# Patient Record
Sex: Male | Born: 1960 | Race: Black or African American | Hispanic: No | Marital: Married | Smoking: Never smoker
Health system: Southern US, Community
[De-identification: ages and names within clinical notes are randomized; demographics above are authoritative.]

## PROBLEM LIST (undated history)

## (undated) DIAGNOSIS — E119 Type 2 diabetes mellitus without complications: Secondary | ICD-10-CM

## (undated) DIAGNOSIS — N289 Disorder of kidney and ureter, unspecified: Secondary | ICD-10-CM

## (undated) DIAGNOSIS — I10 Essential (primary) hypertension: Secondary | ICD-10-CM

## (undated) HISTORY — PX: HEMORRHOIDECTOMY WITH HEMORRHOID BANDING: SHX5633

---

## 2006-02-20 ENCOUNTER — Emergency Department (HOSPITAL_COMMUNITY): Admission: EM | Admit: 2006-02-20 | Discharge: 2006-02-20 | Payer: Self-pay | Admitting: Emergency Medicine

## 2013-02-21 ENCOUNTER — Other Ambulatory Visit: Payer: Self-pay | Admitting: *Deleted

## 2013-02-21 DIAGNOSIS — N189 Chronic kidney disease, unspecified: Secondary | ICD-10-CM

## 2013-03-01 ENCOUNTER — Ambulatory Visit
Admission: RE | Admit: 2013-03-01 | Discharge: 2013-03-01 | Disposition: A | Discharge status: Home / Self Care | Payer: 59 | Source: Ambulatory Visit | Attending: *Deleted | Admitting: *Deleted

## 2013-03-01 DIAGNOSIS — N189 Chronic kidney disease, unspecified: Secondary | ICD-10-CM

## 2014-03-19 IMAGING — US US RENAL
1 series · 14 of 25 positions shown · non-contrast
Comparison: None.

CLINICAL DATA: Chronic kidney disease, hyper 10

EXAM:
RENAL/URINARY TRACT ULTRASOUND COMPLETE

[Series 1: us renal · 0.22mm/px · 14 of 44 slices shown]
[im 1/44]
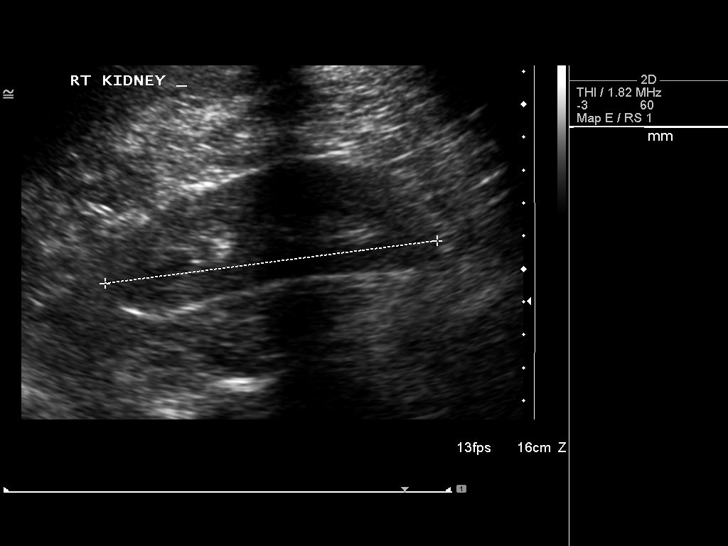
[im 4/44]
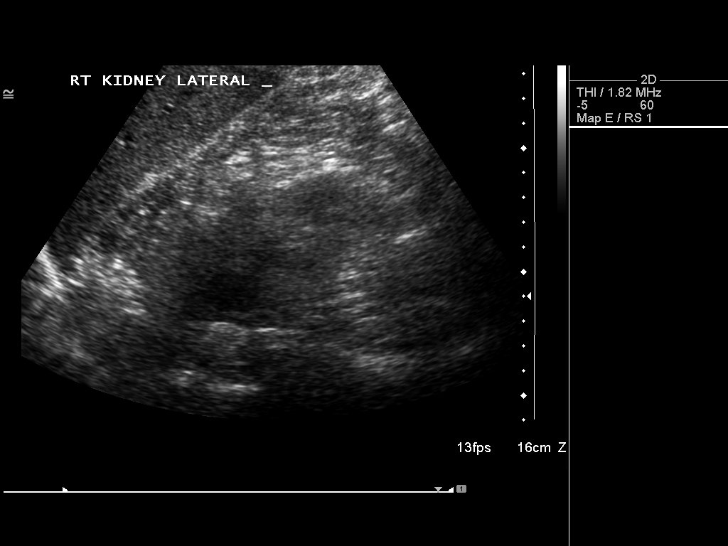
[im 8/44]
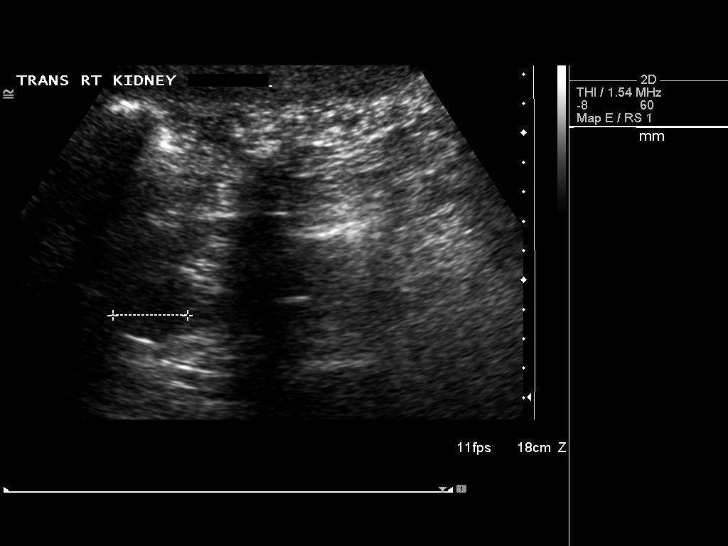
[im 11/44]
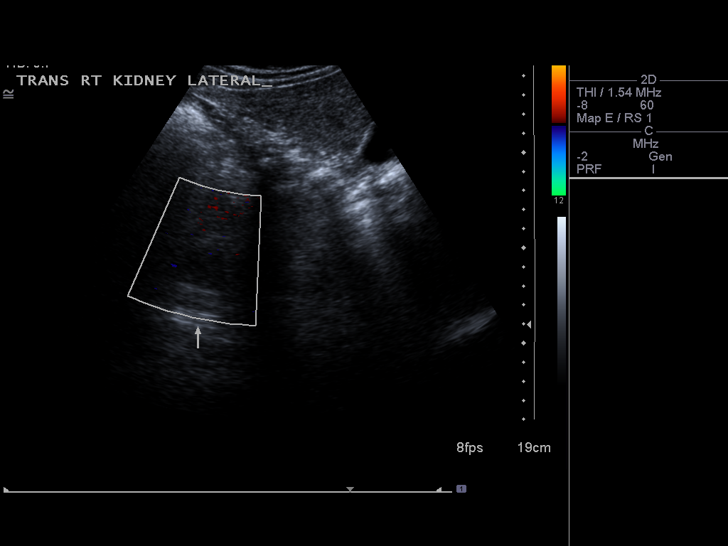
[im 15/44]
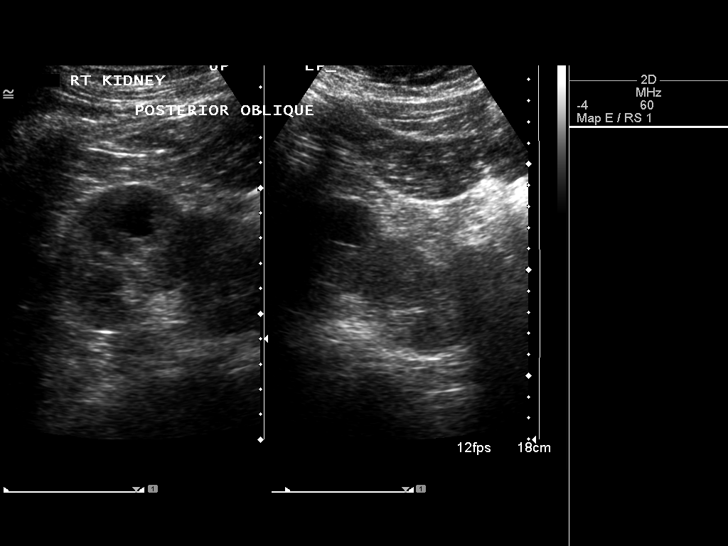
[im 17/44]
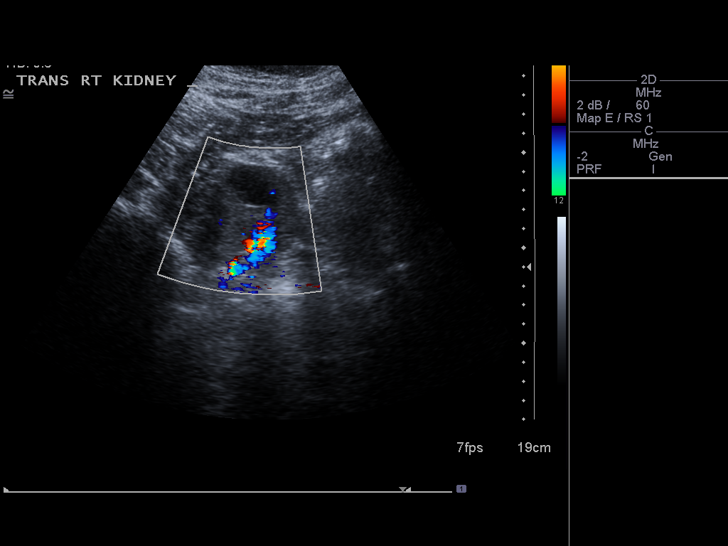
[im 20/44]
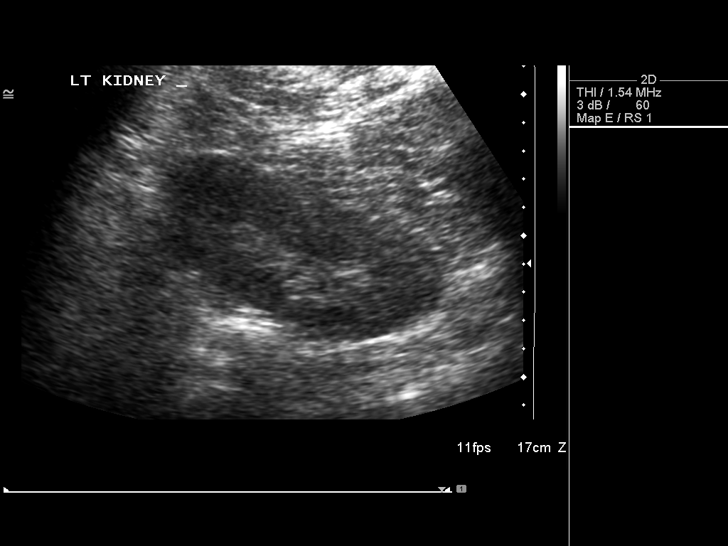
[im 24/44]
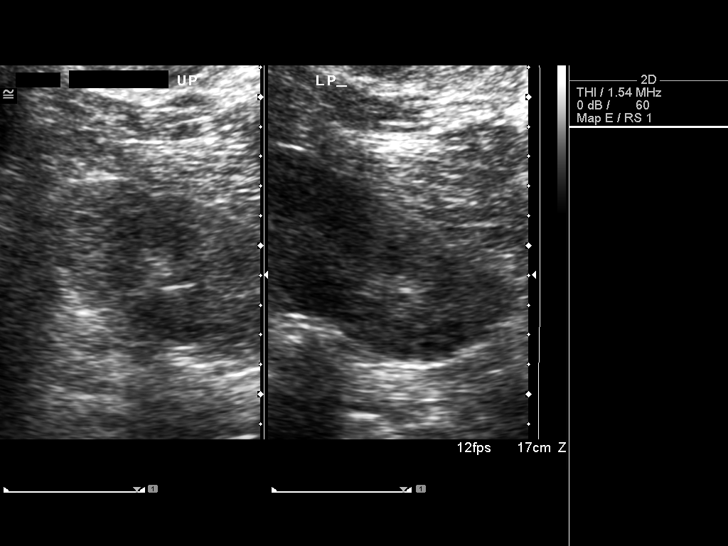
[im 27/44]
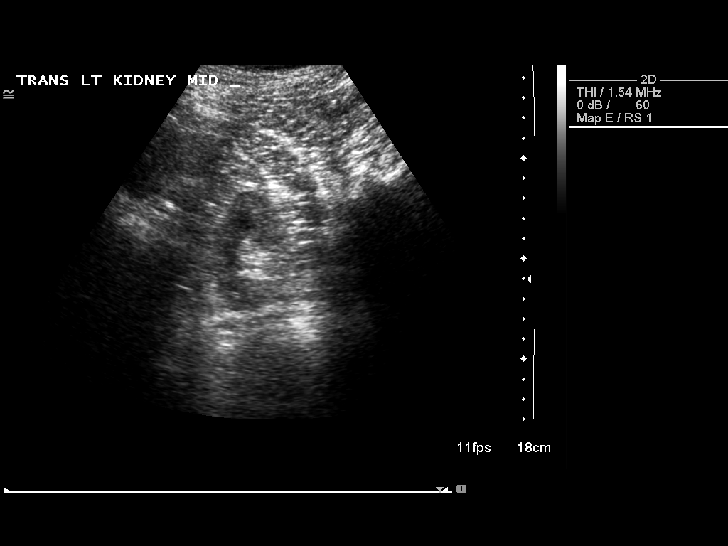
[im 29/44]
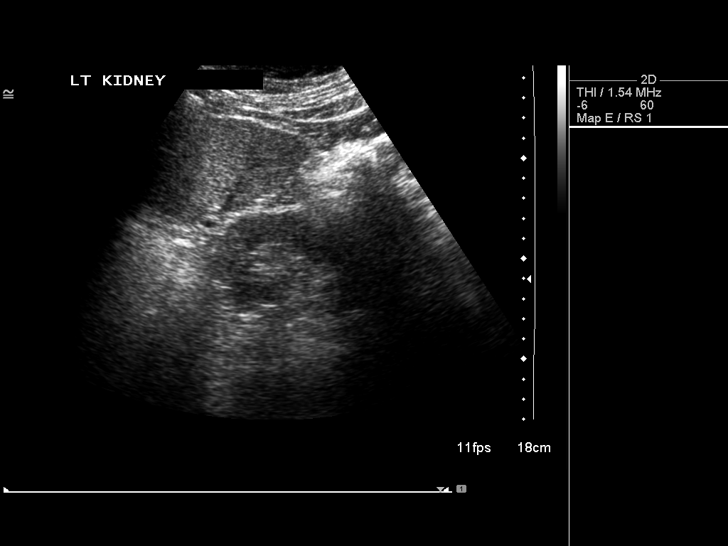
[im 33/44]
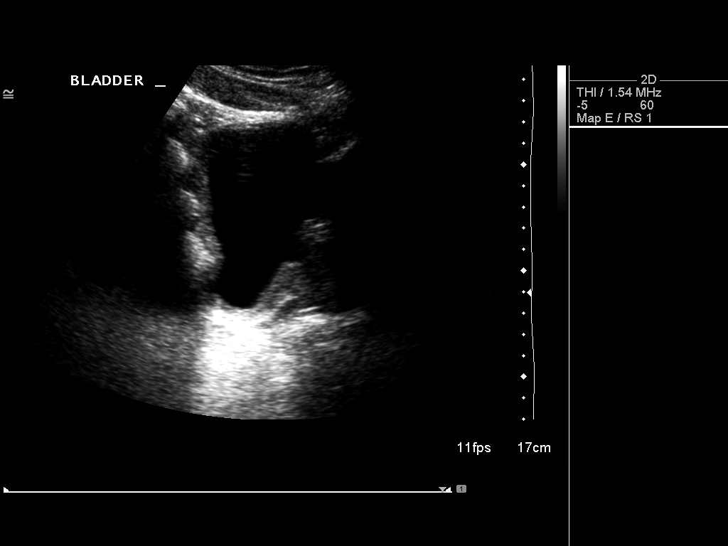
[im 36/44]
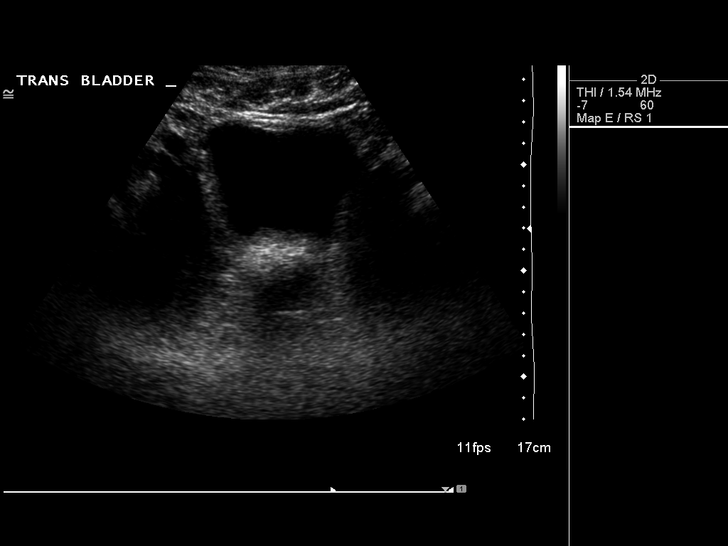
[im 40/44]
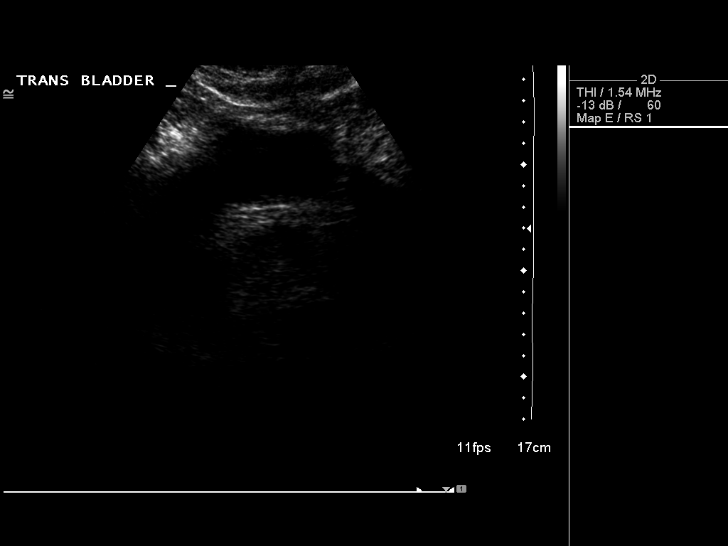
[im 44/44]
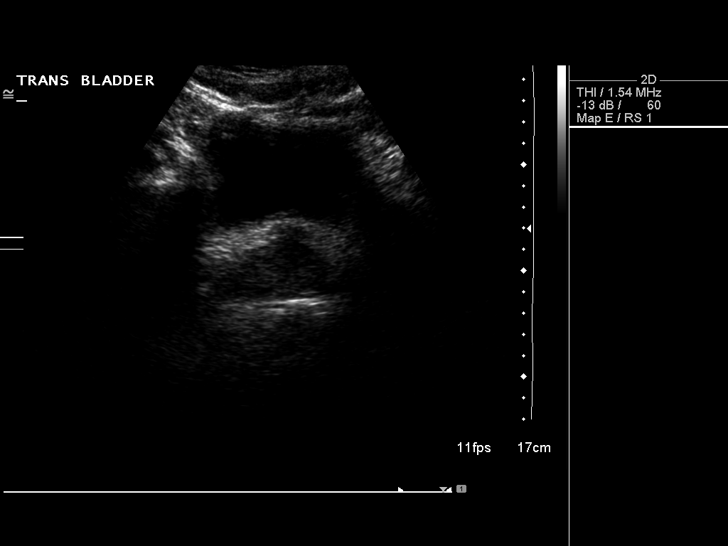

[14 of 25 positions shown; findings below may reference images not displayed]

FINDINGS: Right Kidney

Length: The right kidney measures 10.2 cm sagittally. The
echogenicity of the renal parenchyma is increased consistent with
chronic renal medical disease. A 2.6 cm cyst is noted laterally.

Left Kidney

Length: The left kidney measures 10.7 cm. The echogenicity of the
parenchyma is increased.

Bladder: The urinary bladder is unremarkable, with bilateral
ureteral jets noted.
IMPRESSION: 1. Echogenic renal parenchyma bilaterally consistent with chronic
renal medical disease. No hydronephrosis.
2. Bilateral ureteral jets are noted.

## 2016-11-14 ENCOUNTER — Emergency Department (HOSPITAL_BASED_OUTPATIENT_CLINIC_OR_DEPARTMENT_OTHER)
Admission: EM | Admit: 2016-11-14 | Discharge: 2016-11-14 | Disposition: A | Discharge status: Home / Self Care | Payer: Managed Care, Other (non HMO) | Attending: Emergency Medicine | Admitting: Emergency Medicine

## 2016-11-14 ENCOUNTER — Encounter (HOSPITAL_BASED_OUTPATIENT_CLINIC_OR_DEPARTMENT_OTHER): Payer: Self-pay | Admitting: Emergency Medicine

## 2016-11-14 DIAGNOSIS — E119 Type 2 diabetes mellitus without complications: Secondary | ICD-10-CM | POA: Insufficient documentation

## 2016-11-14 DIAGNOSIS — I10 Essential (primary) hypertension: Secondary | ICD-10-CM | POA: Diagnosis not present

## 2016-11-14 DIAGNOSIS — K649 Unspecified hemorrhoids: Secondary | ICD-10-CM | POA: Diagnosis not present

## 2016-11-14 DIAGNOSIS — K6289 Other specified diseases of anus and rectum: Secondary | ICD-10-CM | POA: Diagnosis present

## 2016-11-14 DIAGNOSIS — Z79899 Other long term (current) drug therapy: Secondary | ICD-10-CM | POA: Diagnosis not present

## 2016-11-14 HISTORY — DX: Essential (primary) hypertension: I10

## 2016-11-14 HISTORY — DX: Type 2 diabetes mellitus without complications: E11.9

## 2016-11-14 HISTORY — DX: Disorder of kidney and ureter, unspecified: N28.9

## 2016-11-14 MED ORDER — LIDOCAINE-HYDROCORTISONE ACE 3-1 % RE KIT: 1.0000 "application " | PACK | Freq: Two times a day (BID) | RECTAL | 0 refills | Status: AC | PRN

## 2016-11-14 NOTE — ED Provider Notes (Signed)
[facility] DEPT Provider Note   CSN: [id number] Arrival date & time: 11/14/16  1728 By signing my name below, I, Dyke Brackett, attest that this documentation has been prepared under the direction and in the presence of Davonna Belling, MD . Electronically Signed: Dyke Brackett, Scribe. 11/14/2016. 8:39 PM.   History   Chief Complaint Chief Complaint  Patient presents with  . Hemorrhoids   HPI Gregory Ashley is a 56 y.o. male who presents to the Emergency Department complaining of moderately painful hemorrhoids onset one week ago. He reports itching and burning to the area; pain is exacerbated by pressure and palpation. Pt has tried Sitz baths and Preporation H with no significant relief. Per pt, this feels similar to prior hemorrhoids. Pt states he had hemorrhoidectomy with hemorrhoid banding 10 years ago. Pt is traveling to [facility] next week and want to ensure that he will be able to travel. He denies any rectal bleeding.  PCP: Kathyrn Lass, MD  The history is provided by the patient. No language interpreter was used.   Past Medical History:  Diagnosis Date  . Diabetes mellitus without complication ([place])   . Hypertension   . Renal disorder     There are no active problems to display for this patient.   Past Surgical History:  Procedure Laterality Date  . HEMORRHOIDECTOMY WITH HEMORRHOID BANDING      Home Medications   Prior to Admission medications   Medication Sig Start Date End Date Taking? Authorizing Provider  enalapril (VASOTEC) 5 MG tablet Take 5 mg by mouth daily.   Yes [provider]  lidocaine-hydrocortisone (ANAMANTLE) 3-1 % KIT Place 1 application rectally 2 (two) times daily as needed (pain). 11/14/16   Davonna Belling, MD    Family History No family history on file.  Social History Social History  Substance Use Topics  . Smoking status: Never Smoker  . Smokeless tobacco: Never Used  . Alcohol use Not on file     Allergies     Patient has no known allergies.   Review of Systems Review of Systems  Constitutional: Negative for fever.  Gastrointestinal: Positive for rectal pain. Negative for anal bleeding.  Skin: Positive for wound.   Physical Exam Updated Vital Signs BP (!) 147/90 (BP Location: Right Arm)   Pulse 68   Temp 98.1 F (36.7 C) (Oral)   Resp 18   Ht 6' 3"  (1.905 m)   Wt 102.1 kg (225 lb)   SpO2 98%   BMI 28.12 kg/m   Physical Exam  Constitutional: He is oriented to person, place, and time. He appears well-developed and well-nourished. No distress.  HENT:  Head: Normocephalic and atraumatic.  Eyes: Conjunctivae are normal.  Cardiovascular: Normal rate.   Pulmonary/Chest: Effort normal.  Abdominal: He exhibits no distension.  Genitourinary: Rectal exam shows external hemorrhoid.  Genitourinary Comments: There is one 5 mm hemorrhoid with mild tenderness. There is a larger 1 cm firm hemorrhoid with no drainage, purulence or drainage.   Neurological: He is alert and oriented to person, place, and time.  Skin: Skin is warm and dry.  Nursing note and vitals reviewed.   ED Treatments / Results  DIAGNOSTIC STUDIES:  Oxygen Saturation is 97% on RA, normal by my interpretation.    COORDINATION OF CARE:  8:38 PM Discussed treatment plan with pt at bedside and pt agreed to plan.   Labs (all labs ordered are listed, but only abnormal results are displayed) Labs Reviewed - No data to display  EKG  EKG Interpretation None       Radiology No results found.  Procedures Procedures (including critical care time)  Medications Ordered in ED Medications - No data to display   Initial Impression / Assessment and Plan / ED Course  I have reviewed the triage vital signs and the nursing notes.  Pertinent labs & imaging results that were available during my care of the patient were reviewed by me and considered in my medical decision making (see chart for details).     Patient with  hemorrhoids. Will need follow-up. No active bleeding. Discharge home.  Final Clinical Impressions(s) / ED Diagnoses   Final diagnoses:  Hemorrhoids, unspecified hemorrhoid type    New Prescriptions Discharge Medication List as of 11/14/2016  8:48 PM    START taking these medications   Details  lidocaine-hydrocortisone (ANAMANTLE) 3-1 % KIT Place 1 application rectally 2 (two) times daily as needed (pain)., Starting Sat 11/14/2016, Print       I personally performed the services described in this documentation, which was scribed in my presence. The recorded information has been reviewed and is accurate.      Davonna Belling, MD 11/16/16 [id number]

## 2016-11-14 NOTE — ED Triage Notes (Signed)
Hemorrhoids x 1 week. Pt has had to have surgery for same in the past. No bleeding noted. Pt is traveling next week and wants to make sure they are ok before he travels.

## 2016-11-14 NOTE — ED Notes (Signed)
Pt called for triage, no response. 

## 2018-02-21 DIAGNOSIS — I10 Essential (primary) hypertension: Secondary | ICD-10-CM | POA: Diagnosis not present

## 2018-02-21 DIAGNOSIS — N183 Chronic kidney disease, stage 3 (moderate): Secondary | ICD-10-CM | POA: Diagnosis not present

## 2018-02-21 DIAGNOSIS — E119 Type 2 diabetes mellitus without complications: Secondary | ICD-10-CM | POA: Diagnosis not present

## 2018-05-03 DIAGNOSIS — I129 Hypertensive chronic kidney disease with stage 1 through stage 4 chronic kidney disease, or unspecified chronic kidney disease: Secondary | ICD-10-CM | POA: Diagnosis not present

## 2018-05-03 DIAGNOSIS — Z13220 Encounter for screening for lipoid disorders: Secondary | ICD-10-CM | POA: Diagnosis not present

## 2018-05-03 DIAGNOSIS — Z0000 Encounter for general adult medical examination without abnormal findings: Secondary | ICD-10-CM | POA: Diagnosis not present

## 2018-05-03 DIAGNOSIS — R7303 Prediabetes: Secondary | ICD-10-CM | POA: Diagnosis not present

## 2018-07-04 DIAGNOSIS — R531 Weakness: Secondary | ICD-10-CM | POA: Diagnosis not present

## 2020-01-20 LAB — COLOGUARD: COLOGUARD: NEGATIVE

## 2020-01-20 LAB — EXTERNAL GENERIC LAB PROCEDURE: COLOGUARD: NEGATIVE

## 2020-09-24 ENCOUNTER — Other Ambulatory Visit (HOSPITAL_COMMUNITY): Payer: Self-pay | Admitting: Home Modifications

## 2020-09-24 DIAGNOSIS — M79662 Pain in left lower leg: Secondary | ICD-10-CM

## 2020-09-25 ENCOUNTER — Other Ambulatory Visit: Payer: Self-pay

## 2020-09-25 ENCOUNTER — Ambulatory Visit (HOSPITAL_COMMUNITY)
Admission: RE | Admit: 2020-09-25 | Discharge: 2020-09-25 | Disposition: A | Discharge status: Home / Self Care | Payer: 59 | Source: Ambulatory Visit | Attending: Home Modifications | Admitting: Home Modifications

## 2020-09-25 DIAGNOSIS — M7989 Other specified soft tissue disorders: Secondary | ICD-10-CM | POA: Diagnosis not present

## 2020-09-25 DIAGNOSIS — M79662 Pain in left lower leg: Secondary | ICD-10-CM | POA: Insufficient documentation

## 2020-09-25 NOTE — Progress Notes (Signed)
Left lower extremity venous duplex has been completed. Preliminary results can be found in CV Proc through chart review.  Results were given to [facility] at [place] NP office.  09/25/20 10:14 AM Olen Cordial RVT
